# Patient Record
Sex: Female | Born: 1960 | Race: White | Hispanic: No | Marital: Married | Smoking: Never smoker
Health system: Southern US, Community
[De-identification: ages and names within clinical notes are randomized; demographics above are authoritative.]

---

## 1998-04-19 ENCOUNTER — Other Ambulatory Visit: Admission: RE | Admit: 1998-04-19 | Discharge: 1998-04-19 | Payer: Self-pay | Admitting: *Deleted

## 1999-05-15 ENCOUNTER — Other Ambulatory Visit: Admission: RE | Admit: 1999-05-15 | Discharge: 1999-05-15 | Payer: Self-pay | Admitting: *Deleted

## 2000-05-19 ENCOUNTER — Other Ambulatory Visit: Admission: RE | Admit: 2000-05-19 | Discharge: 2000-05-19 | Payer: Self-pay | Admitting: *Deleted

## 2000-11-25 ENCOUNTER — Other Ambulatory Visit: Admission: RE | Admit: 2000-11-25 | Discharge: 2000-11-25 | Payer: Self-pay | Admitting: *Deleted

## 2001-06-23 ENCOUNTER — Other Ambulatory Visit: Admission: RE | Admit: 2001-06-23 | Discharge: 2001-06-23 | Payer: Self-pay | Admitting: *Deleted

## 2002-08-27 ENCOUNTER — Other Ambulatory Visit: Admission: RE | Admit: 2002-08-27 | Discharge: 2002-08-27 | Payer: Self-pay | Admitting: *Deleted

## 2002-09-15 ENCOUNTER — Ambulatory Visit (HOSPITAL_COMMUNITY): Admission: RE | Admit: 2002-09-15 | Discharge: 2002-09-15 | Payer: Self-pay | Admitting: Gastroenterology

## 2003-08-30 ENCOUNTER — Other Ambulatory Visit: Admission: RE | Admit: 2003-08-30 | Discharge: 2003-08-30 | Payer: Self-pay | Admitting: *Deleted

## 2004-11-23 ENCOUNTER — Other Ambulatory Visit: Admission: RE | Admit: 2004-11-23 | Discharge: 2004-11-23 | Payer: Self-pay | Admitting: Obstetrics and Gynecology

## 2012-09-15 ENCOUNTER — Emergency Department (HOSPITAL_COMMUNITY)
Admission: EM | Admit: 2012-09-15 | Discharge: 2012-09-15 | Disposition: A | Discharge status: Home / Self Care | Payer: BC Managed Care – PPO | Source: Home / Self Care | Attending: Emergency Medicine | Admitting: Emergency Medicine

## 2012-09-15 ENCOUNTER — Encounter (HOSPITAL_COMMUNITY): Payer: Self-pay | Admitting: Emergency Medicine

## 2012-09-15 DIAGNOSIS — R51 Headache: Secondary | ICD-10-CM

## 2012-09-15 MED ORDER — HYDROCODONE-ACETAMINOPHEN 5-500 MG PO TABS: 1.0000 | ORAL_TABLET | Freq: Four times a day (QID) | ORAL | Status: AC | PRN

## 2012-09-15 NOTE — ED Provider Notes (Signed)
History     CSN: [id number]  Arrival date & time 09/15/12  1307   First MD Initiated Contact with Patient 09/15/12 1310      Chief Complaint  Patient presents with  . Headache    (Consider location/radiation/quality/duration/timing/severity/associated sxs/prior treatment) HPI Comments: Patient presents urgent care complaining of an headache is mainly located to her for head. She has noted some nonspecific blurred vision of both eyes, describes the pain as dull in character but it's not severe. Patient denies any further symptoms such as fevers, numbness or weakness of any upper or lower extremities. Denies any coexistent respiratory symptoms cough or shortness of breath fevers. Denies any recent trauma or injury. Feels a bit lightheaded occasionally. Denies any further symptoms. He does not suffer from migraine headaches, describes that should her son is about to be released boot camp.   Patient is a 51 y.o. female presenting with headaches. The history is provided by the patient.  Headache The primary symptoms include headaches. Primary symptoms do not include syncope, loss of consciousness, altered mental status, seizures, dizziness, visual change, paresthesias, focal weakness, loss of sensation, speech change, fever, nausea or vomiting. The symptoms are unchanged. The neurological symptoms are focal.  The headache is not associated with photophobia, visual change, paresthesias or weakness.  Additional symptoms do not include weakness or photophobia.    History reviewed. No pertinent past medical history.  History reviewed. No pertinent past surgical history.  No family history on file.  History  Substance Use Topics  . Smoking status: Never Smoker   . Smokeless tobacco: Not on file  . Alcohol Use: No    OB History    Grav Para Term Preterm Abortions TAB SAB Ect Mult Living                  Review of Systems  Constitutional: Negative for fever, chills, activity change  and appetite change.  Eyes: Negative for photophobia, redness, itching and visual disturbance.  Respiratory: Negative for shortness of breath.   Cardiovascular: Negative for syncope.  Gastrointestinal: Negative for nausea and vomiting.  Musculoskeletal: Negative for joint swelling.  Skin: Negative for pallor and rash.  Neurological: Positive for light-headedness and headaches. Negative for dizziness, tremors, speech change, focal weakness, seizures, loss of consciousness, syncope, weakness, numbness and paresthesias.  Psychiatric/Behavioral: Negative for altered mental status.    Allergies  Review of patient's allergies indicates no known allergies.  Home Medications   Current Outpatient Rx  Name Route Sig Dispense Refill  . OMEGA-3 FATTY ACIDS 1000 MG PO CAPS Oral Take 2 g by mouth daily.    . MULTIVITAMINS PO CAPS Oral Take 1 capsule by mouth daily.    Marland Kitchen HYDROCODONE-ACETAMINOPHEN 5-500 MG PO TABS Oral Take 1-2 tablets by mouth every 6 (six) hours as needed for pain. 15 tablet 0    BP 165/90  Pulse 65  Temp 99.3 F (37.4 C) (Oral)  Resp 16  SpO2 99%  Physical Exam  Nursing note and vitals reviewed. Constitutional: She appears well-developed and well-nourished.  Non-toxic appearance. She does not have a sickly appearance. She does not appear ill. No distress.  Eyes: Conjunctivae normal and EOM are normal. Pupils are equal, round, and reactive to light. No foreign bodies found.    Neck: Neck supple. No JVD present.  Cardiovascular: Normal rate.  Exam reveals no gallop and no friction rub.   No murmur heard. Pulmonary/Chest: Effort normal and breath sounds normal. No respiratory distress. She has no decreased  breath sounds. She has no wheezes. She has no rhonchi. She has no rales.  Abdominal: Normal appearance.  Musculoskeletal: She exhibits no tenderness.  Lymphadenopathy:    She has no cervical adenopathy.  Neurological: She is alert. She displays no tremor. No cranial  nerve deficit or sensory deficit. She exhibits normal muscle tone. Coordination and gait normal. GCS eye subscore is 4. GCS verbal subscore is 5. GCS motor subscore is 6.  Skin: Skin is warm. No erythema.    ED Course  Procedures (including critical care time)  Labs Reviewed - No data to display No results found.   1. Headache       MDM  Patient looks comfortable in no distress with mild headache resembles more a potential headaches. No further neurological symptoms or findings on her exam today. We have discussed what symptoms should warrant further evaluation in the emergency department she agrees with treatment plan and followup care as necessary.        Jimmie Molly, MD 09/15/12 [phone]

## 2012-09-15 NOTE — ED Notes (Signed)
Onset 09/12/2012: patient was playing ball with [employer] noticed vision being blurry.  Sunday reported as feeling ok, "may have had a dull headache".  Monday noticed dull pain in head, vision seemed ok for the most  Part.  Today reports dull headache, vision is not normal " i dont know how to describe it".  Reports pressure in forehead area.  No weakness in extremities, denies awkwardness doing usual daily activities.  Patient reports lightheadedness more than dizzy.  No spinning room.

## 2014-10-18 ENCOUNTER — Other Ambulatory Visit: Payer: Self-pay | Admitting: Family Medicine

## 2014-10-18 DIAGNOSIS — R221 Localized swelling, mass and lump, neck: Secondary | ICD-10-CM

## 2014-10-26 ENCOUNTER — Other Ambulatory Visit: Payer: Self-pay | Admitting: Family Medicine

## 2014-10-26 ENCOUNTER — Ambulatory Visit
Admission: RE | Admit: 2014-10-26 | Discharge: 2014-10-26 | Disposition: A | Discharge status: Home / Self Care | Payer: BC Managed Care – PPO | Source: Ambulatory Visit | Attending: Family Medicine | Admitting: Family Medicine

## 2014-10-26 DIAGNOSIS — R221 Localized swelling, mass and lump, neck: Secondary | ICD-10-CM

## 2014-10-26 MED ORDER — IOHEXOL 300 MG/ML  SOLN
75.0000 mL | Freq: Once | INTRAMUSCULAR | Status: AC | PRN
  Administered 2014-10-26: 75 mL via INTRAVENOUS

## 2014-11-03 ENCOUNTER — Other Ambulatory Visit (HOSPITAL_COMMUNITY): Payer: Self-pay | Admitting: Otolaryngology

## 2014-11-03 DIAGNOSIS — R221 Localized swelling, mass and lump, neck: Secondary | ICD-10-CM

## 2014-11-14 ENCOUNTER — Other Ambulatory Visit: Payer: Self-pay | Admitting: Radiology

## 2014-11-16 ENCOUNTER — Ambulatory Visit (HOSPITAL_COMMUNITY)
Admission: RE | Admit: 2014-11-16 | Discharge: 2014-11-16 | Disposition: A | Discharge status: Home / Self Care | Payer: BC Managed Care – PPO | Source: Ambulatory Visit | Attending: Otolaryngology | Admitting: Otolaryngology

## 2014-11-16 DIAGNOSIS — R221 Localized swelling, mass and lump, neck: Secondary | ICD-10-CM

## 2014-11-16 DIAGNOSIS — K118 Other diseases of salivary glands: Secondary | ICD-10-CM | POA: Insufficient documentation

## 2014-11-16 LAB — CBC
HEMATOCRIT: 40.3 % (ref 36.0–46.0)
HEMOGLOBIN: 13.9 g/dL (ref 12.0–15.0)
MCH: 30.3 pg (ref 26.0–34.0)
MCHC: 34.5 g/dL (ref 30.0–36.0)
MCV: 88 fL (ref 78.0–100.0)
Platelets: 177 10*3/uL (ref 150–400)
RBC: 4.58 MIL/uL (ref 3.87–5.11)
RDW: 13.4 % (ref 11.5–15.5)
WBC: 4.1 10*3/uL (ref 4.0–10.5)

## 2014-11-16 LAB — APTT: aPTT: 35 seconds (ref 24–37)

## 2014-11-16 LAB — PROTIME-INR
INR: 1.03 (ref 0.00–1.49)
Prothrombin Time: 13.6 seconds (ref 11.6–15.2)

## 2014-11-16 MED ORDER — LIDOCAINE HCL (PF) 1 % IJ SOLN
INTRAMUSCULAR | Status: AC
  Filled 2014-11-16: qty 10

## 2014-11-16 MED ORDER — SODIUM CHLORIDE 0.9 % IV SOLN: INTRAVENOUS | Status: DC

## 2014-11-16 NOTE — Procedures (Signed)
Interventional Radiology Procedure Note  Procedure: US guided FNA of left parotid nodule.   Complications: None Recommendations: - DC home - Path pending  Signed,  Sterling BigHeath K. McCullough, MD

## 2014-11-30 ENCOUNTER — Other Ambulatory Visit: Payer: Self-pay | Admitting: Otolaryngology

## 2015-02-10 ENCOUNTER — Other Ambulatory Visit: Payer: Self-pay | Admitting: Obstetrics and Gynecology

## 2015-02-13 LAB — CYTOLOGY - PAP

## 2015-12-15 IMAGING — US US BIOPSY
1 series · 13 of 19 positions shown · non-contrast
Comparison: CT scan of the neck 10/26/2014

CLINICAL DATA: 53-year-old female with palpable left parotid
nodule. Ultrasound-guided evaluation followed by biopsy if a nodule
is identified.

EXAM:
ULTRASOUND GUIDED NEEDLE ASPIRATE BIOPSY OF THE THYROID GLAND

[Series 1: us biopsy · 0.04mm/px · 19 acquisitions, 13 frames shown]
[im 1/19]
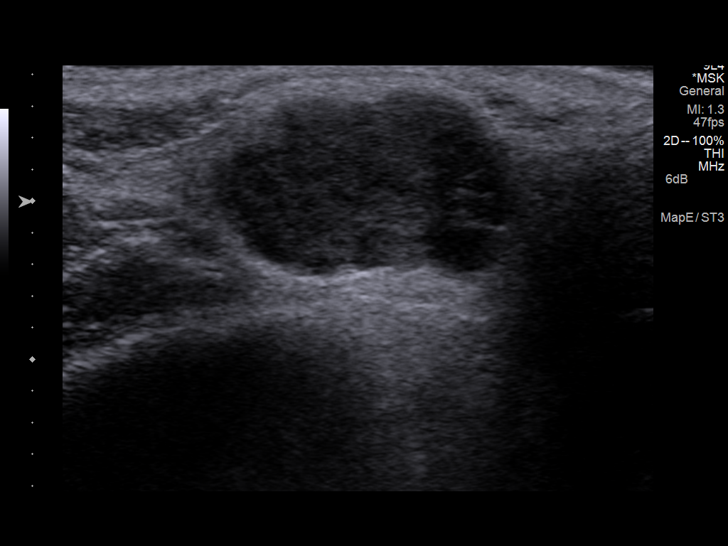
[im 3/19]
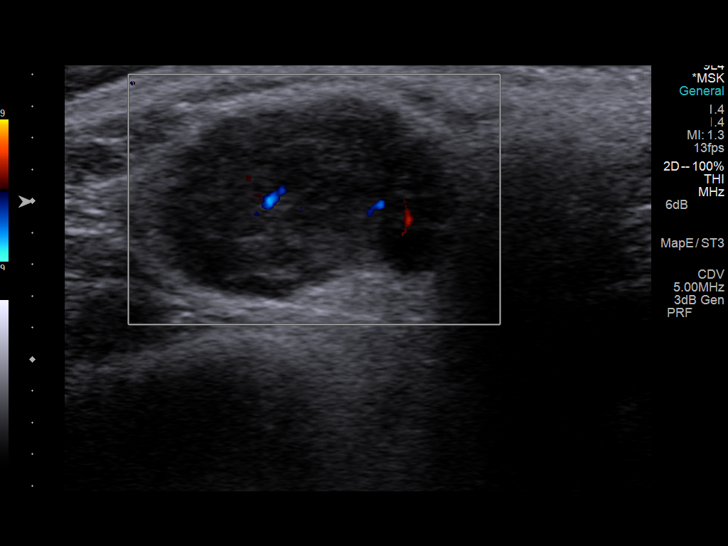
[im 4/19]
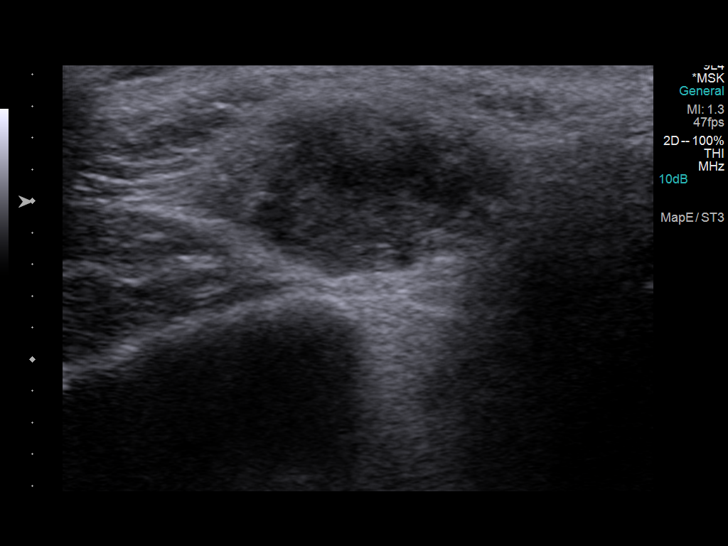
[im 6/19]
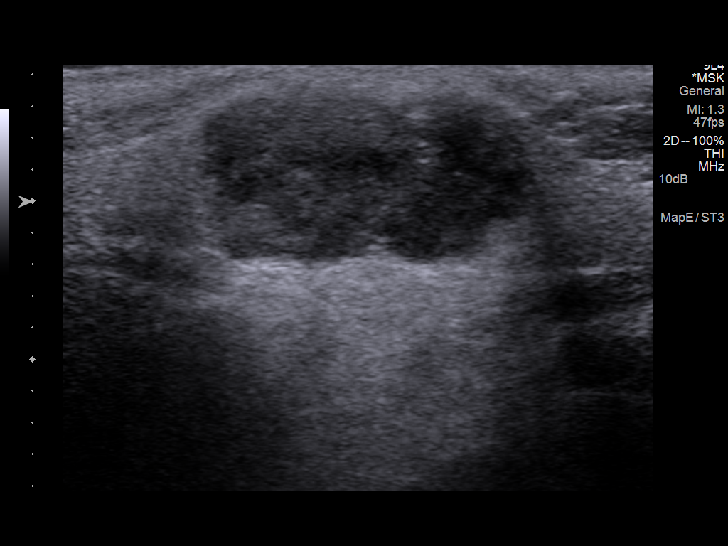
[im 7/19]
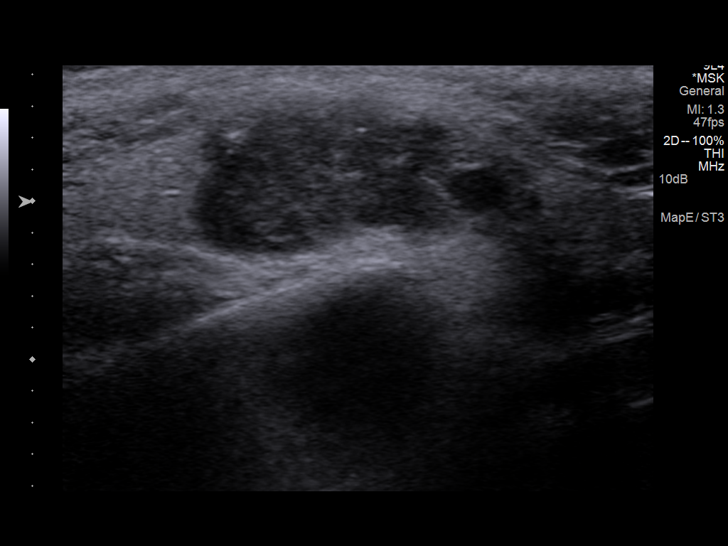
[im 9/19]
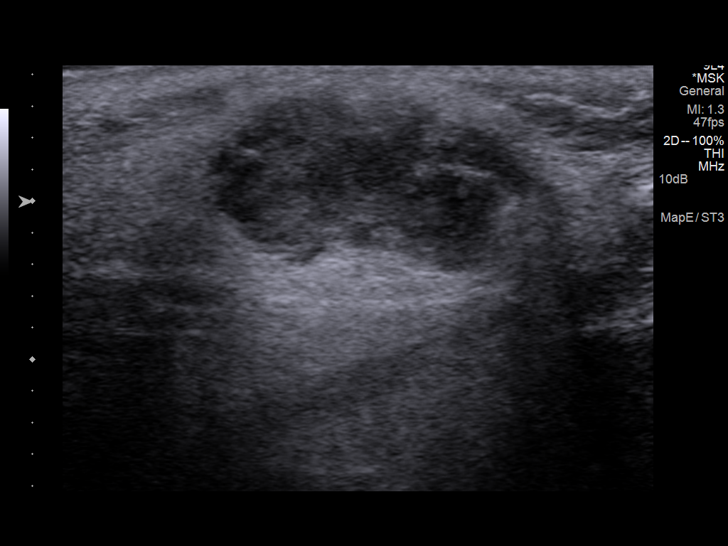
[im 10/19]
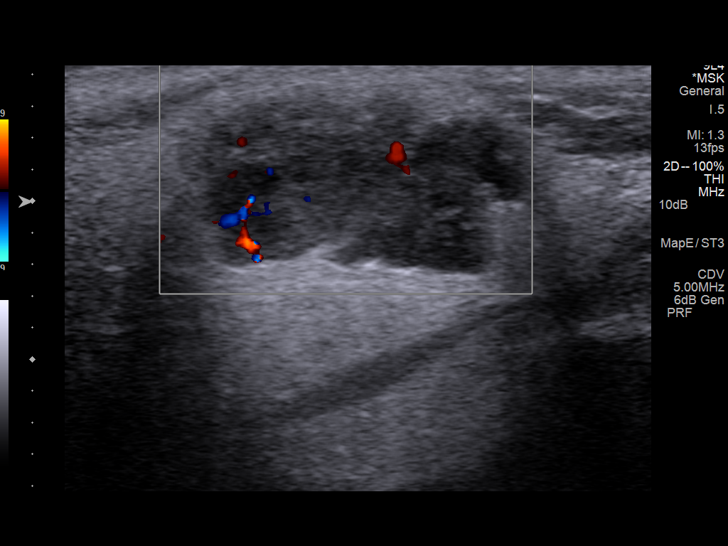
[im 11/19]
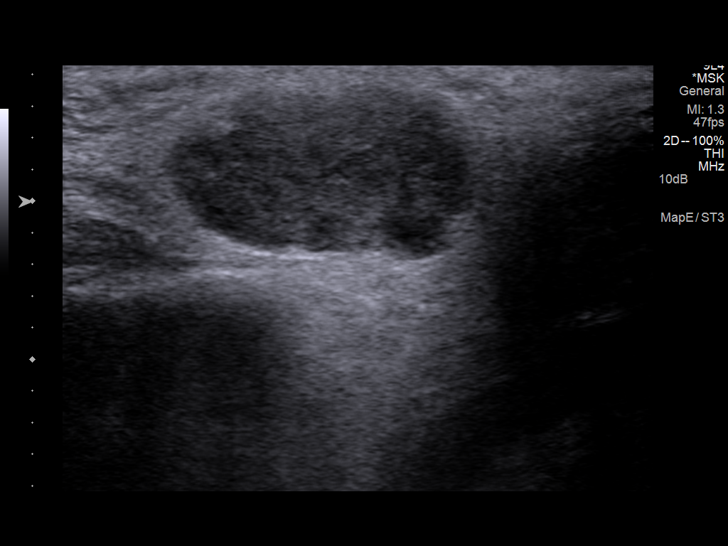
[im 13/19]
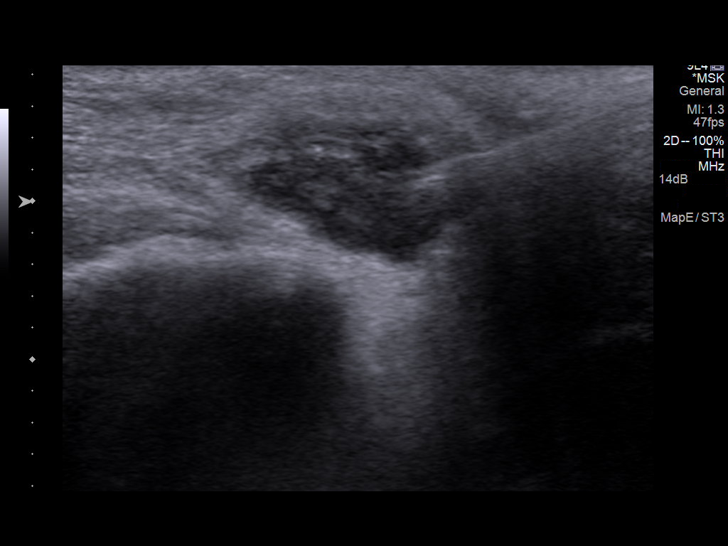
[im 14/19]
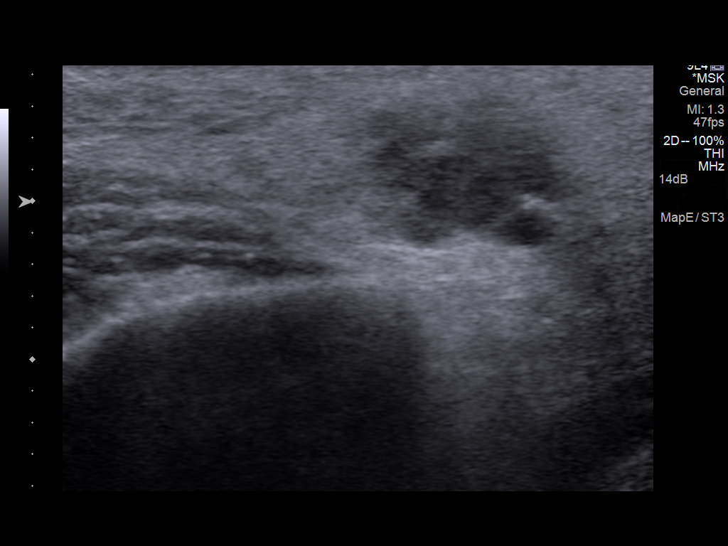
[im 16/19]
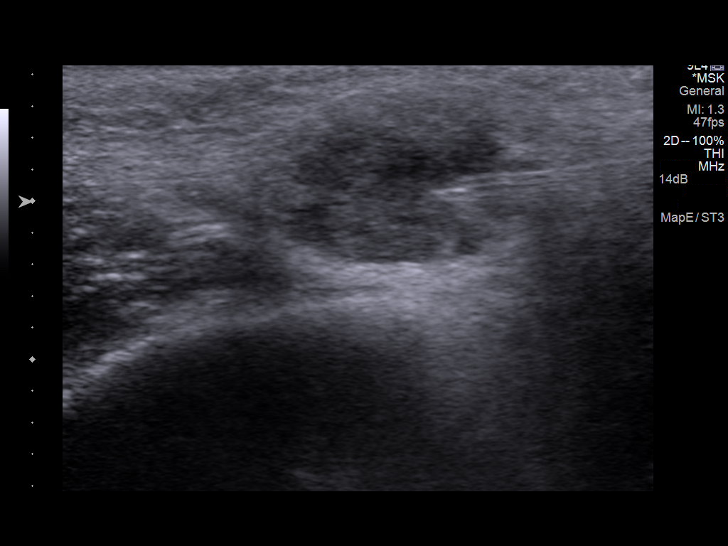
[im 17/19]
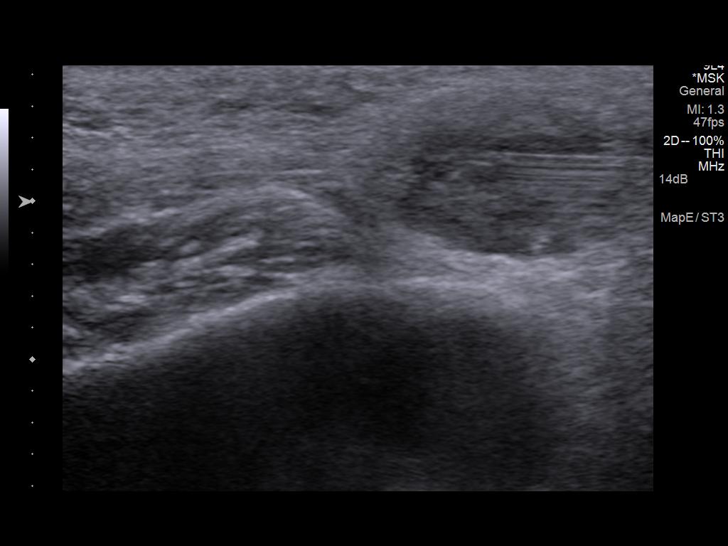
[im 19/19]
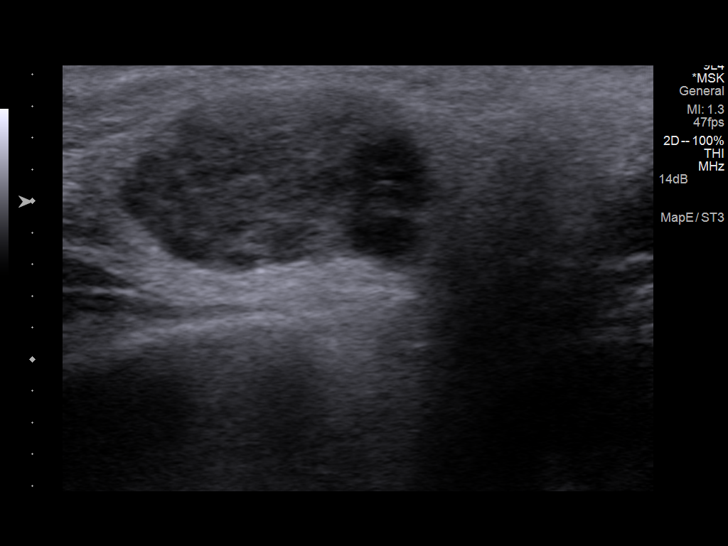

[13 of 19 positions shown; findings below may reference images not displayed]

PROCEDURE:
Thyroid biopsy was thoroughly discussed with the patient and
questions were answered. The benefits, risks, alternatives, and
complications were also discussed. The patient understands and
wishes to proceed with the procedure. Written consent was obtained.

Ultrasound was performed to localize and mark an adequate site for
the biopsy. The patient was then prepped and draped in a normal
sterile fashion. Local anesthesia was provided with 1% lidocaine.
Using direct ultrasound guidance, 4 passes were made using needles
into the nodule within the superficial aspect of the left parotid
gland. Ultrasound was used to confirm needle placements on all
occasions. Specimens were sent to Pathology for analysis.

COMPLICATIONS:
COMPLICATIONS
None immediate.
FINDINGS: Macro lobulated 1.9 x 1.2 cm hypoechoic nodule with internal
vascularity in the superficial aspect of the inferior parotid gland.
Biopsy aspirates were somewhat gelatinous in consistency.
IMPRESSION: Ultrasound guided needle aspirate biopsy performed of the left
parotid nodule.

## 2018-02-10 DIAGNOSIS — R030 Elevated blood-pressure reading, without diagnosis of hypertension: Secondary | ICD-10-CM | POA: Diagnosis not present

## 2018-06-19 DIAGNOSIS — N3001 Acute cystitis with hematuria: Secondary | ICD-10-CM | POA: Diagnosis not present

## 2018-06-19 DIAGNOSIS — R300 Dysuria: Secondary | ICD-10-CM | POA: Diagnosis not present

## 2018-06-19 DIAGNOSIS — Z6834 Body mass index (BMI) 34.0-34.9, adult: Secondary | ICD-10-CM | POA: Diagnosis not present

## 2018-08-04 DIAGNOSIS — Z0000 Encounter for general adult medical examination without abnormal findings: Secondary | ICD-10-CM | POA: Diagnosis not present

## 2018-08-04 DIAGNOSIS — Z23 Encounter for immunization: Secondary | ICD-10-CM | POA: Diagnosis not present

## 2018-08-04 DIAGNOSIS — E559 Vitamin D deficiency, unspecified: Secondary | ICD-10-CM | POA: Diagnosis not present

## 2019-10-22 ENCOUNTER — Other Ambulatory Visit: Payer: Self-pay

## 2019-10-22 DIAGNOSIS — Z20822 Contact with and (suspected) exposure to covid-19: Secondary | ICD-10-CM

## 2019-10-24 LAB — NOVEL CORONAVIRUS, NAA: SARS-CoV-2, NAA: NOT DETECTED

## 2020-01-25 DIAGNOSIS — Z20828 Contact with and (suspected) exposure to other viral communicable diseases: Secondary | ICD-10-CM | POA: Diagnosis not present

## 2020-04-01 ENCOUNTER — Ambulatory Visit: Payer: Self-pay | Attending: Internal Medicine

## 2020-04-01 DIAGNOSIS — Z23 Encounter for immunization: Secondary | ICD-10-CM

## 2020-04-01 NOTE — Progress Notes (Signed)
   Covid-19 Vaccination Clinic  Name:  Hansini Clodfelter    MRN: [id number] DOB: 09/23/1961  04/01/2020  Ms. Cuoco was observed post Covid-19 immunization for 15 minutes without incident. She was provided with Vaccine Information Sheet and instruction to access the V-Safe system.   Ms. Kunst was instructed to call 911 with any severe reactions post vaccine: Marland Kitchen Difficulty breathing  . Swelling of face and throat  . A fast heartbeat  . A bad rash all over body  . Dizziness and weakness   Immunizations Administered    Name Date Dose VIS Date Route   Pfizer COVID-19 Vaccine 04/01/2020  8:46 AM 0.3 mL 11/26/2019 Intramuscular   Manufacturer: [employer], [employer]   Lot: [id number]   NDC: [address]-7

## 2020-04-22 ENCOUNTER — Ambulatory Visit: Payer: Self-pay | Attending: Internal Medicine

## 2020-04-22 DIAGNOSIS — Z23 Encounter for immunization: Secondary | ICD-10-CM

## 2020-04-22 NOTE — Progress Notes (Signed)
   Covid-19 Vaccination Clinic  Name:  [name]    MRN: [id number] DOB: 1961-04-09  04/22/2020  [name] was observed post Covid-19 immunization for 15 minutes without incident. She was provided with Vaccine Information Sheet and instruction to access the V-Safe system.   [name] was instructed to call 911 with any severe reactions post vaccine: Marland Kitchen Difficulty breathing  . Swelling of face and throat  . A fast heartbeat  . A bad rash all over body  . Dizziness and weakness   Immunizations Administered    Name Date Dose VIS Date Route   Pfizer COVID-19 Vaccine 04/22/2020  9:04 AM 0.3 mL 02/09/2019 Intramuscular   Manufacturer: [employer], [employer]   Lot: [id number]   NDC: [address]-2

## 2022-02-07 DIAGNOSIS — Z0000 Encounter for general adult medical examination without abnormal findings: Secondary | ICD-10-CM | POA: Diagnosis not present

## 2022-02-07 DIAGNOSIS — Z130 Encounter for screening for diseases of the blood and blood-forming organs and certain disorders involving the immune mechanism: Secondary | ICD-10-CM | POA: Diagnosis not present

## 2022-02-07 DIAGNOSIS — Z13228 Encounter for screening for other metabolic disorders: Secondary | ICD-10-CM | POA: Diagnosis not present

## 2022-02-07 DIAGNOSIS — Z01419 Encounter for gynecological examination (general) (routine) without abnormal findings: Secondary | ICD-10-CM | POA: Diagnosis not present

## 2022-02-19 ENCOUNTER — Other Ambulatory Visit: Payer: Self-pay | Admitting: *Deleted

## 2022-02-19 DIAGNOSIS — Z1231 Encounter for screening mammogram for malignant neoplasm of breast: Secondary | ICD-10-CM

## 2022-04-25 ENCOUNTER — Ambulatory Visit
Admission: RE | Admit: 2022-04-25 | Discharge: 2022-04-25 | Disposition: A | Discharge status: Home / Self Care | Payer: BC Managed Care – PPO | Source: Ambulatory Visit | Attending: *Deleted | Admitting: *Deleted

## 2022-04-25 DIAGNOSIS — Z1231 Encounter for screening mammogram for malignant neoplasm of breast: Secondary | ICD-10-CM | POA: Diagnosis not present

## 2022-07-26 DIAGNOSIS — R300 Dysuria: Secondary | ICD-10-CM | POA: Diagnosis not present

## 2022-07-26 DIAGNOSIS — I10 Essential (primary) hypertension: Secondary | ICD-10-CM | POA: Diagnosis not present

## 2023-02-15 DIAGNOSIS — H524 Presbyopia: Secondary | ICD-10-CM | POA: Diagnosis not present

## 2023-05-13 DIAGNOSIS — R350 Frequency of micturition: Secondary | ICD-10-CM | POA: Diagnosis not present

## 2023-05-13 DIAGNOSIS — R030 Elevated blood-pressure reading, without diagnosis of hypertension: Secondary | ICD-10-CM | POA: Diagnosis not present

## 2023-05-13 DIAGNOSIS — Z8639 Personal history of other endocrine, nutritional and metabolic disease: Secondary | ICD-10-CM | POA: Diagnosis not present

## 2023-05-13 DIAGNOSIS — Z0000 Encounter for general adult medical examination without abnormal findings: Secondary | ICD-10-CM | POA: Diagnosis not present

## 2023-05-13 DIAGNOSIS — E7800 Pure hypercholesterolemia, unspecified: Secondary | ICD-10-CM | POA: Diagnosis not present

## 2023-08-27 DIAGNOSIS — E7800 Pure hypercholesterolemia, unspecified: Secondary | ICD-10-CM | POA: Diagnosis not present

## 2023-09-29 ENCOUNTER — Other Ambulatory Visit: Payer: Self-pay | Admitting: Family Medicine

## 2023-09-29 DIAGNOSIS — Z1231 Encounter for screening mammogram for malignant neoplasm of breast: Secondary | ICD-10-CM

## 2023-10-22 ENCOUNTER — Ambulatory Visit: Payer: BC Managed Care – PPO

## 2023-10-30 ENCOUNTER — Ambulatory Visit
Admission: RE | Admit: 2023-10-30 | Discharge: 2023-10-30 | Disposition: A | Discharge status: Home / Self Care | Payer: BC Managed Care – PPO | Source: Ambulatory Visit | Attending: Family Medicine | Admitting: Family Medicine

## 2023-10-30 DIAGNOSIS — Z1231 Encounter for screening mammogram for malignant neoplasm of breast: Secondary | ICD-10-CM

## 2023-12-02 DIAGNOSIS — R399 Unspecified symptoms and signs involving the genitourinary system: Secondary | ICD-10-CM | POA: Diagnosis not present

## 2023-12-02 DIAGNOSIS — E7800 Pure hypercholesterolemia, unspecified: Secondary | ICD-10-CM | POA: Diagnosis not present

## 2023-12-02 DIAGNOSIS — N390 Urinary tract infection, site not specified: Secondary | ICD-10-CM | POA: Diagnosis not present

## 2024-08-05 DIAGNOSIS — E7800 Pure hypercholesterolemia, unspecified: Secondary | ICD-10-CM | POA: Diagnosis not present

## 2024-08-05 DIAGNOSIS — Z23 Encounter for immunization: Secondary | ICD-10-CM | POA: Diagnosis not present

## 2024-08-05 DIAGNOSIS — Z0000 Encounter for general adult medical examination without abnormal findings: Secondary | ICD-10-CM | POA: Diagnosis not present

## 2024-08-05 DIAGNOSIS — E559 Vitamin D deficiency, unspecified: Secondary | ICD-10-CM | POA: Diagnosis not present

## 2024-10-06 DIAGNOSIS — E7800 Pure hypercholesterolemia, unspecified: Secondary | ICD-10-CM | POA: Diagnosis not present

## 2024-10-20 DIAGNOSIS — Z23 Encounter for immunization: Secondary | ICD-10-CM | POA: Diagnosis not present

## 2024-10-28 ENCOUNTER — Other Ambulatory Visit: Payer: Self-pay | Admitting: Family Medicine

## 2024-10-28 DIAGNOSIS — Z1231 Encounter for screening mammogram for malignant neoplasm of breast: Secondary | ICD-10-CM

## 2024-11-02 ENCOUNTER — Ambulatory Visit
Admission: RE | Admit: 2024-11-02 | Discharge: 2024-11-02 | Disposition: A | Payer: BC Managed Care – PPO | Source: Ambulatory Visit | Attending: Family Medicine | Admitting: Family Medicine

## 2024-11-02 DIAGNOSIS — Z1231 Encounter for screening mammogram for malignant neoplasm of breast: Secondary | ICD-10-CM
